# Patient Record
Sex: Female | Born: 1970 | Hispanic: Yes | Marital: Single
Health system: Southern US, Community
[De-identification: ages and names within clinical notes are randomized; demographics above are authoritative.]

## PROBLEM LIST (undated history)

## (undated) DIAGNOSIS — Z1371 Encounter for nonprocreative screening for genetic disease carrier status: Secondary | ICD-10-CM

---

## 2007-07-10 ENCOUNTER — Ambulatory Visit: Payer: Self-pay

## 2007-07-15 ENCOUNTER — Ambulatory Visit: Payer: Self-pay

## 2008-07-16 ENCOUNTER — Encounter: Payer: Self-pay | Admitting: Maternal & Fetal Medicine

## 2008-12-20 ENCOUNTER — Observation Stay: Payer: Self-pay | Admitting: Obstetrics and Gynecology

## 2008-12-23 ENCOUNTER — Inpatient Hospital Stay: Payer: Self-pay | Admitting: Obstetrics and Gynecology

## 2010-07-06 ENCOUNTER — Ambulatory Visit: Payer: Self-pay

## 2010-08-05 ENCOUNTER — Ambulatory Visit: Payer: Self-pay

## 2011-09-19 ENCOUNTER — Ambulatory Visit: Payer: Self-pay

## 2012-11-27 ENCOUNTER — Ambulatory Visit: Payer: Self-pay

## 2014-05-06 ENCOUNTER — Ambulatory Visit: Payer: Self-pay

## 2014-11-18 ENCOUNTER — Ambulatory Visit: Payer: Self-pay | Attending: Oncology

## 2014-11-18 VITALS — BP 131/86 | HR 72 | Temp 97.9°F | Ht <= 58 in | Wt 145.5 lb

## 2014-11-18 DIAGNOSIS — IMO0002 Reserved for concepts with insufficient information to code with codable children: Secondary | ICD-10-CM

## 2014-11-18 NOTE — Progress Notes (Signed)
Subjective:     Patient ID: Eyvette Cordon, female   DOB: 10/08/1970, 44 y.o.   MRN: [id number]  HPI   Review of Systems     Objective:   Physical Exam  Genitourinary: No labial fusion. There is no rash, tenderness, lesion or injury on the right labia. There is no rash, tenderness, lesion or injury on the left labia. Uterus is not deviated, not enlarged, not fixed and not tender. Cervix exhibits no motion tenderness, no discharge and no friability. Right adnexum displays no mass, no tenderness and no fullness. Left adnexum displays no mass, no tenderness and no fullness. No erythema, tenderness or bleeding in the vagina. No foreign body around the vagina. No signs of injury around the vagina. No vaginal discharge found.       Assessment:  44 year old patient returns for 6 month follow-up pap.  She had a benign cervical biopsy in April 2016 with Dr. Jean Rosenthal at [place].  Normal pelvic exam.     Plan:     Specimen collected for pap.  Claretha Cooper Murr interpreted exam.

## 2014-11-26 LAB — PAP LB AND HPV HIGH-RISK
HPV, high-risk: POSITIVE — AB
PAP Smear Comment: 0

## 2014-11-30 ENCOUNTER — Telehealth: Payer: Self-pay | Admitting: *Deleted

## 2014-11-30 ENCOUNTER — Encounter: Payer: Self-pay | Admitting: *Deleted

## 2014-11-30 NOTE — Telephone Encounter (Signed)
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  Called patient via the language line.  Left message for her to return my call.

## 2014-12-02 ENCOUNTER — Telehealth: Payer: Self-pay | Admitting: *Deleted

## 2014-12-02 NOTE — Progress Notes (Signed)
Kimberly Gordon to schedule appointment with Dr. Jean Rosenthal at [facility] for consult/colposcopy.  If unable to reach patient by phone , will mail request for her to call for results, and appointment date, and time.

## 2014-12-08 ENCOUNTER — Telehealth: Payer: Self-pay | Admitting: *Deleted

## 2014-12-08 NOTE — Telephone Encounter (Signed)
Dr. Jean Rosenthal from [facility] called today.  Patient was in his office, and he was not sure why she needed to be seen, neither did the patient.  Explained that last pap on 9/22 was HPV + LGSIL.  He stated he should have recommended to [place] to repeat the pap in 1 year, instead of 6 months from her benign biopsy in March 2016.  He will see her today.  We are to schedule patient to return in March for her annual screening and per Dr. Jean Rosenthal another pap smear.  Hazle Quant will call patient tomorrow and get her scheduled.

## 2014-12-09 ENCOUNTER — Telehealth: Payer: Self-pay | Admitting: *Deleted

## 2014-12-10 NOTE — Telephone Encounter (Signed)
See note from 12/08/14

## 2014-12-10 NOTE — Progress Notes (Signed)
Letter received from Dr. Jean Rosenthal stating patient does not need colposcopy, but will need annual pap in March 2017.  Kimberly Gordon contacting patient to schedule annual [facility] screening.

## 2014-12-11 ENCOUNTER — Telehealth: Payer: Self-pay | Admitting: *Deleted

## 2015-05-10 ENCOUNTER — Ambulatory Visit: Payer: Self-pay | Attending: Oncology

## 2015-05-12 ENCOUNTER — Ambulatory Visit: Payer: Self-pay

## 2015-09-22 ENCOUNTER — Ambulatory Visit
Admission: RE | Admit: 2015-09-22 | Discharge: 2015-09-22 | Disposition: A | Discharge status: Home / Self Care | Payer: Self-pay | Source: Ambulatory Visit | Attending: Internal Medicine | Admitting: Internal Medicine

## 2015-09-22 ENCOUNTER — Ambulatory Visit: Payer: Self-pay | Attending: Internal Medicine | Admitting: *Deleted

## 2015-09-22 ENCOUNTER — Encounter: Payer: Self-pay | Admitting: *Deleted

## 2015-09-22 VITALS — BP 125/81 | HR 71 | Temp 98.1°F | Ht 59.06 in | Wt 154.3 lb

## 2015-09-22 DIAGNOSIS — Z0000 Encounter for general adult medical examination without abnormal findings: Secondary | ICD-10-CM

## 2015-09-22 HISTORY — DX: Encounter for nonprocreative screening for genetic disease carrier status: Z13.71

## 2015-09-22 NOTE — Patient Instructions (Signed)
Gave patient hand-out, Women Staying Healthy, Active and Well from [facility], with education on breast health, pap smears, heart and colon health. 

## 2015-09-22 NOTE — Progress Notes (Signed)
Subjective:     Patient ID: Kimberly Gordon. Kimberly Gordon, female   DOB: 11-18-1970, 45 y.o.   MRN: [id number]  HPI   Review of Systems     Objective:   Physical Exam  Pulmonary/Chest: Right breast exhibits no inverted nipple, no mass, no nipple discharge, no skin change and no tenderness. Left breast exhibits no inverted nipple, no mass, no nipple discharge, no skin change and no tenderness. Breasts are symmetrical.  Abdominal: There is no splenomegaly or hepatomegaly.  Genitourinary: Rectal exam shows no external hemorrhoid and no internal hemorrhoid. No labial fusion. There is no rash, tenderness, lesion or injury on the right labia. There is no rash, tenderness, lesion or injury on the left labia. Cervix exhibits no motion tenderness, no discharge and no friability. Right adnexum displays no mass, no tenderness and no fullness. Left adnexum displays no mass, no tenderness and no fullness. No erythema, tenderness or bleeding in the vagina. No foreign body around the vagina. No signs of injury around the vagina. Vaginal discharge found.  White non odorous discharge       Assessment:     45 year old Hispanic [facility]  Kimberly Gordon, the interpreter present during the interview and exam.  Clinical breast exam unremarkable.  Taught self breast awareness.  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  Per Dr. Jean Gordon, patient would need a pap in one year.  Specimen collected for pap.  Patient has been screened for eligibility.  She does not have any insurance, Medicare or Medicaid.  She also meets financial eligibility.  Hand-out given on the Affordable Care Act.      Plan:     Screening mammogram ordered.  Specimen sent to the lab.  Will follow up per [facility] protocol.

## 2015-09-25 LAB — PAP LB AND HPV HIGH-RISK
HPV, high-risk: POSITIVE — AB
PAP Smear Comment: 0

## 2015-10-07 ENCOUNTER — Encounter: Payer: Self-pay | Admitting: *Deleted

## 2015-10-07 NOTE — Progress Notes (Signed)
Patient called yesterday to get the results of her pap smear.  Reviewed results via the interpreter Annice Pih.  Informed of normal mammo and pap, but HPV positive.  Scheduled to return to [facility] in one year with annual exam and pap.

## 2016-09-27 ENCOUNTER — Ambulatory Visit: Payer: Self-pay | Attending: Oncology

## 2016-11-29 ENCOUNTER — Encounter: Payer: Self-pay | Admitting: *Deleted

## 2016-11-29 ENCOUNTER — Ambulatory Visit
Admission: RE | Admit: 2016-11-29 | Discharge: 2016-11-29 | Disposition: A | Discharge status: Home / Self Care | Payer: Self-pay | Source: Ambulatory Visit | Attending: Oncology | Admitting: Oncology

## 2016-11-29 ENCOUNTER — Ambulatory Visit: Payer: Self-pay | Attending: Oncology | Admitting: *Deleted

## 2016-11-29 VITALS — BP 118/62 | HR 54 | Temp 98.7°F | Resp 18 | Ht <= 58 in | Wt 137.7 lb

## 2016-11-29 DIAGNOSIS — Z0000 Encounter for general adult medical examination without abnormal findings: Secondary | ICD-10-CM

## 2016-11-29 NOTE — Patient Instructions (Signed)
Prueba del VPH (HPV Test) La prueba del virus del papiloma humano (VPH) se usa para detectar los tipos de infeccin por el VPH de alto riesgo. El VPH es un grupo de alrededor de 100 virus. Muchos de estos virus causan tumores dentro de los genitales, sobre ellos o a su alrededor. La mayora de los VPH provocan infecciones que suelen desaparecen sin tratamiento. Sin embargo, los tipos 6, 11, 16 y 18 del VPH se consideran de alto riesgo y pueden aumentar el riesgo de padecer cncer de cuello del tero o de ano si la infeccin no se trata. La prueba del VPH identifica las cadenas de ADN (genticas) de la infeccin por el VPH, por lo que tambin se denomina prueba de ADN para el VPH. Aunque el VPH se encuentra tanto en los hombres como en las mujeres, la prueba del VPH se usa solo para detectar un mayor riesgo de cncer en las mujeres:  Con una prueba de Papanicolaou anormal.  Despus del tratamiento de una prueba de Papanicolaou anormal.  Entre 30y 65aos.  Despus del tratamiento de una infeccin por el VPH de alto riesgo. La prueba del VPH se puede hacer al mismo tiempo que un examen plvico y una prueba de Papanicolaou en mujeres de ms de 30 aos. Tanto la prueba del VPH como la prueba de Papanicolaou requieren una muestra de clulas del cuello del tero. PREPARACIN PARA EL ESTUDIO  No se haga lavados vaginales ni se d un bao durante 24 a 48 horas antes de la prueba o como se lo haya indicado el mdico.  No tenga sexo durante 24 a 48 horas antes de la prueba o como se lo haya indicado el mdico.  Es posible que se le pida que reprograme la prueba si est menstruando.  Se le pedir que orine antes de la prueba. RESULTADOS Es su responsabilidad retirar el resultado del estudio. Consulte en el laboratorio o en el departamento en el que fue realizado el estudio cundo y cmo podr obtener los resultados. Hable con el mdico si tiene alguna pregunta sobre los resultados. El resultado ser  negativo o positivo. Significado de los resultados negativos de la prueba Un resultado negativo de la prueba del VPH significa que no se detect el VPH, y es muy probable que no tenga el virus. Significado de los resultados positivos de la prueba Un resultado positivo de la prueba del VPH indica que tiene el virus.  Si el resultado de la prueba muestra la presencia de alguna cadena del VPH de alto riesgo, puede tener mayor riesgo de padecer cncer de cuello del tero o de ano si la infeccin no se trata.  Si se encuentran cadenas del VPH de bajo riesgo, es poco probable que tenga un alto riesgo de padecer cncer. Hable con el mdico sobre los resultados. El mdico utilizar los resultados para realizar un diagnstico y determinar un plan de tratamiento adecuado para usted. Esta informacin no tiene como fin reemplazar el consejo del mdico. Asegrese de hacerle al mdico cualquier pregunta que tenga. Document Released: 06/08/2008 Document Revised: 03/13/2014 Document Reviewed: 07/08/2013 Elsevier Interactive Patient Education  2017 Elsevier Inc.  Gave patient hand-out, Women Staying Healthy, Active and Well from [facility], with education on breast health, pap smears, heart and colon health.  

## 2016-11-29 NOTE — Progress Notes (Signed)
Subjective:     Patient ID: Kimberly Gordon, female   DOB: 1970/12/14, 46 y.o.   MRN: [id number]  HPI   Review of Systems     Objective:   Physical Exam  Pulmonary/Chest: Right breast exhibits no inverted nipple, no mass, no nipple discharge, no skin change and no tenderness. Left breast exhibits no inverted nipple, no mass, no nipple discharge, no skin change and no tenderness. Breasts are symmetrical.  Abdominal: There is no splenomegaly or hepatomegaly.  Genitourinary: No labial fusion. There is no rash, tenderness, lesion or injury on the right labia. There is no rash, tenderness, lesion or injury on the left labia. Cervix exhibits no motion tenderness, no discharge and no friability. Right adnexum displays no mass, no tenderness and no fullness. Left adnexum displays no mass, no tenderness and no fullness. No erythema, tenderness or bleeding in the vagina. No foreign body in the vagina. No signs of injury around the vagina. Vaginal discharge found.  Genitourinary Comments: Bloody vaginal discharge noted.  Patient states she just finished her menstrual cycle on  year old Hispanic female returns to [facility] for [facility] screening.  Lloyda, the interpreter present during the interview and exam.  Clinical breast exam unremarkable.  Taught self breast awareness.  Last pap on 09/22/15 was negative / HPV positive.  Specimen collected for pap smear.  Patient has been screened for eligibility.  She does not have any insurance, Medicare or Medicaid.  She also meets financial eligibility.  Hand-out given on the Affordable Care Act.    Plan:     Screening mammogram ordered.  Specimen for pap sent to the lab.  Explained to patient if the pap results show HPV positive or other abnormal findings will refer her back to Dr. Jean Rosenthal.  Will follow-up per [facility] protocol.

## 2016-11-30 LAB — PAP LB AND HPV HIGH-RISK
HPV, high-risk: NEGATIVE
PAP Smear Comment: 0

## 2016-12-04 ENCOUNTER — Telehealth: Payer: Self-pay | Admitting: *Deleted

## 2016-12-04 NOTE — Telephone Encounter (Addendum)
Patient has a negative / HPV positive pap smear for the second year in a row.  She will need referral to gyn for colposcopy.  Left message via Kandis Cocking, the interpreter for patient to return my call.   Patient returned my call via Kandis Cocking, the interpreter.  Informed her of her HPV positive normal pap and need for possible colposcopy.  She had seen Dr. Jean Rosenthal at [facility] in the past.  I will schedule her to see him, but patient could not stay on the phone.  Left message per patient's request via Lloyda the interpreter, with the date of her appointment with Dr. Jean Rosenthal on Wednesday, 12/27/16 @ 2:00.

## 2016-12-27 ENCOUNTER — Ambulatory Visit: Payer: Self-pay | Admitting: Obstetrics and Gynecology

## 2017-01-16 ENCOUNTER — Telehealth: Payer: Self-pay | Admitting: *Deleted

## 2017-01-16 NOTE — Telephone Encounter (Signed)
Called patient via Kimberly Gordon, the interpreter.  Patient did not show up for her appointment with Dr. Jean RosenthalJackson.  Left her message to return my call.  I would like to reschedule her appointment.

## 2017-02-06 ENCOUNTER — Encounter: Payer: Self-pay | Admitting: *Deleted

## 2017-02-06 NOTE — Progress Notes (Signed)
Called patient via Kandis CockingMaritza, the interpreter.  I wanted to reschedule the patient to see Dr. Jean RosenthalJackson for further evaluation of her second HPV positive / negative pap smear over the past 2 years.  Patient states she did not reschedule the appointment she missed, because [place] is in [place] and will not be back to [place] until February.  Offered to set up an appointment for March, but patient states she will just call me when she is back.  Will close current [facility] case as refusal to follow up and will re-open when she returns.  [facility] to [place].

## 2019-09-27 ENCOUNTER — Other Ambulatory Visit: Payer: Self-pay

## 2019-09-27 ENCOUNTER — Ambulatory Visit: Payer: Self-pay | Attending: Internal Medicine

## 2019-09-27 DIAGNOSIS — Z23 Encounter for immunization: Secondary | ICD-10-CM

## 2019-09-27 NOTE — Progress Notes (Signed)
   Covid-19 Vaccination Clinic  Name:  Kimberly Gordon    MRN: [id number] DOB: August 17, 1970  09/27/2019  Ms. Kimberly Gordon was observed post Covid-19 immunization for 15 minutes without incident. She was provided with Vaccine Information Sheet and instruction to access the V-Safe system.   Ms. Kimberly Gordon was instructed to call 911 with any severe reactions post vaccine: Marland Kitchen Difficulty breathing  . Swelling of face and throat  . A fast heartbeat  . A bad rash all over body  . Dizziness and weakness   Immunizations Administered    Name Date Dose VIS Date Route   Pfizer COVID-19 Vaccine 09/27/2019  8:21 AM 0.3 mL 04/30/2018 Intramuscular   Manufacturer: [employer], [employer]   Lot: [id number]   NDC: [address]-1

## 2019-10-06 ENCOUNTER — Ambulatory Visit: Payer: Self-pay

## 2019-10-20 ENCOUNTER — Ambulatory Visit: Payer: Self-pay | Attending: Internal Medicine

## 2019-10-20 ENCOUNTER — Ambulatory Visit: Payer: Self-pay

## 2019-10-20 DIAGNOSIS — Z23 Encounter for immunization: Secondary | ICD-10-CM

## 2019-10-20 NOTE — Progress Notes (Signed)
   Covid-19 Vaccination Clinic  Name:  Kimberly Gordon    MRN: [id number] DOB: 09-Dec-1970  10/20/2019  Ms. Kimberly Gordon was observed post Covid-19 immunization for 15 minutes without incident. She was provided with Vaccine Information Sheet and instruction to access the V-Safe system.   Ms. Kimberly Gordon was instructed to call 911 with any severe reactions post vaccine: Marland Kitchen Difficulty breathing  . Swelling of face and throat  . A fast heartbeat  . A bad rash all over body  . Dizziness and weakness   Immunizations Administered    Name Date Dose VIS Date Route   Pfizer COVID-19 Vaccine 10/20/2019  4:25 PM 0.3 mL 04/30/2018 Intramuscular   Manufacturer: [employer], [employer]   Lot: [id number]   NDC: [address]-4

## 2019-12-17 ENCOUNTER — Encounter (INDEPENDENT_AMBULATORY_CARE_PROVIDER_SITE_OTHER): Payer: Self-pay

## 2019-12-17 ENCOUNTER — Ambulatory Visit
Admission: RE | Admit: 2019-12-17 | Discharge: 2019-12-17 | Disposition: A | Discharge status: Home / Self Care | Payer: Self-pay | Source: Ambulatory Visit | Attending: Oncology | Admitting: Oncology

## 2019-12-17 ENCOUNTER — Ambulatory Visit: Payer: Self-pay | Attending: Oncology

## 2019-12-17 ENCOUNTER — Other Ambulatory Visit: Payer: Self-pay

## 2019-12-17 VITALS — BP 127/65 | HR 63 | Temp 98.3°F | Ht 59.0 in | Wt 141.0 lb

## 2019-12-17 DIAGNOSIS — Z0000 Encounter for general adult medical examination without abnormal findings: Secondary | ICD-10-CM

## 2019-12-17 NOTE — Progress Notes (Signed)
  Subjective:     Patient ID: Kimberly Gordon, female   DOB: Sep 07, 1970, 49 y.o.   MRN: [id number]  HPI   Review of Systems     Objective:   Physical Exam     Assessment:       Plan:

## 2019-12-17 NOTE — Progress Notes (Signed)
  Subjective:     Patient ID: Sharlyne Cai, female   DOB: [birth year], 49 y.o.   MRN: [id number]  HPI   Review of Systems     Objective:   Physical Exam Chest:     Breasts:        Right: No swelling, bleeding, inverted nipple, mass, nipple discharge, skin change or tenderness.        Left: No swelling, bleeding, inverted nipple, mass, nipple discharge, skin change or tenderness.  Genitourinary:    Labia:        Right: No rash, tenderness, lesion or injury.        Left: No rash, tenderness, lesion or injury.      Vagina: No signs of injury and foreign body. No vaginal discharge, erythema, tenderness, bleeding, lesions or prolapsed vaginal walls.     Cervix: No cervical motion tenderness, discharge, friability, lesion, erythema, cervical bleeding or eversion.     Uterus: Not deviated, not enlarged, not fixed, not tender and no uterine prolapse.      Adnexa:        Right: No mass, tenderness or fullness.         Left: No mass, tenderness or fullness.          Assessment:     49 year old Hispanic patient returns for [facility] screening.  Will repeat pap for 2 negative annual results.  Patient screened, and meets [facility] eligibility.  Patient does not have insurance, Medicare or Medicaid. Instructed patient on breast self awareness using teach back method.  Clinical breast exam unremarkable.  Pelvic exam normal.  Delos Haring interpreted exam.    Plan:     Sent for bilateral screening mammogram.  Specimen collected for pap.

## 2019-12-19 ENCOUNTER — Other Ambulatory Visit: Payer: Self-pay | Admitting: *Deleted

## 2019-12-19 DIAGNOSIS — N6489 Other specified disorders of breast: Secondary | ICD-10-CM

## 2019-12-22 LAB — IGP, APTIMA HPV: HPV Aptima: NEGATIVE

## 2020-01-02 ENCOUNTER — Ambulatory Visit: Payer: Self-pay

## 2020-01-19 ENCOUNTER — Other Ambulatory Visit: Payer: Self-pay

## 2020-01-23 ENCOUNTER — Telehealth: Payer: Self-pay | Admitting: *Deleted

## 2020-01-23 NOTE — Telephone Encounter (Signed)
PT NOS AV / MAMMO APPT 

## 2020-02-09 NOTE — Progress Notes (Signed)
Patient ID: Kimberly Gordon, female   DOB: 1970-03-28, 49 y.o.   MRN: [id number] Reached patient to rescheduled additional views.  She is scheduled for 02/20/20 at 1:00p.m.  Verbalizes understanding via interpreter.

## 2020-02-20 ENCOUNTER — Ambulatory Visit
Admission: RE | Admit: 2020-02-20 | Discharge: 2020-02-20 | Disposition: A | Discharge status: Home / Self Care | Payer: Self-pay | Source: Ambulatory Visit | Attending: Oncology | Admitting: Oncology

## 2020-02-20 ENCOUNTER — Other Ambulatory Visit: Payer: Self-pay

## 2020-02-20 DIAGNOSIS — N6489 Other specified disorders of breast: Secondary | ICD-10-CM

## 2020-04-25 NOTE — Progress Notes (Signed)
Letter mailed to patient to notify of normal mammogram, and pap smear results. Repeat pap in one year.  Copy to [facility].

## 2021-08-15 IMAGING — MG MM DIGITAL DIAGNOSTIC UNILAT*L* W/ TOMO W/ CAD
4 series · 4 of 12 positions shown · non-contrast
Comparison: Previous exam(s).

CLINICAL DATA: SCREENING RECALL FOR A LEFT BREAST ASYMMETRY.

EXAM:
DIGITAL DIAGNOSTIC UNILATERAL LEFT MAMMOGRAM WITH TOMO AND CAD;
ULTRASOUND LEFT BREAST LIMITED

[L ML synth-2D]
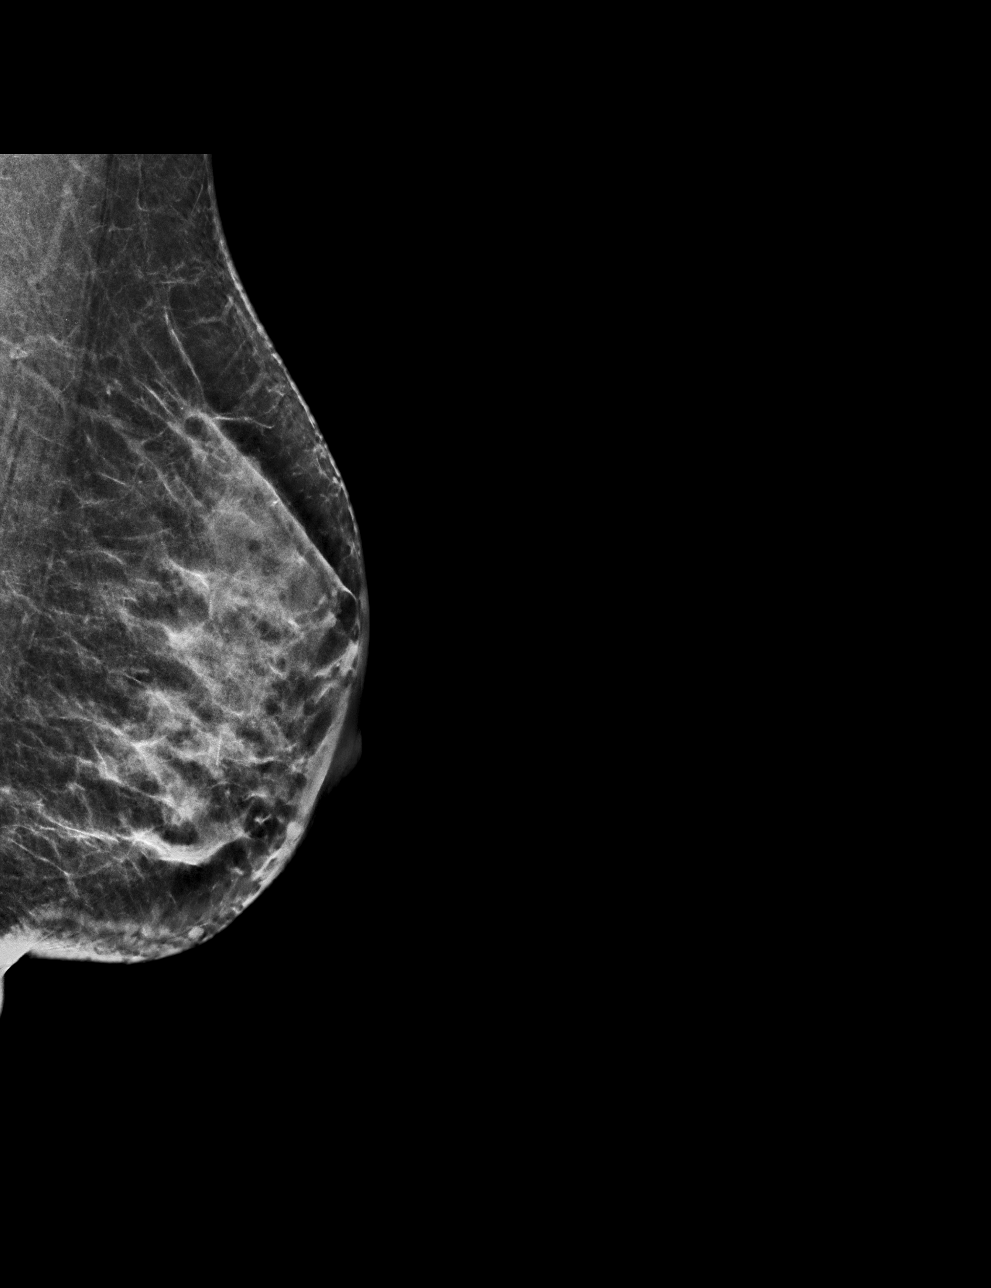

[L CC synth-2D]
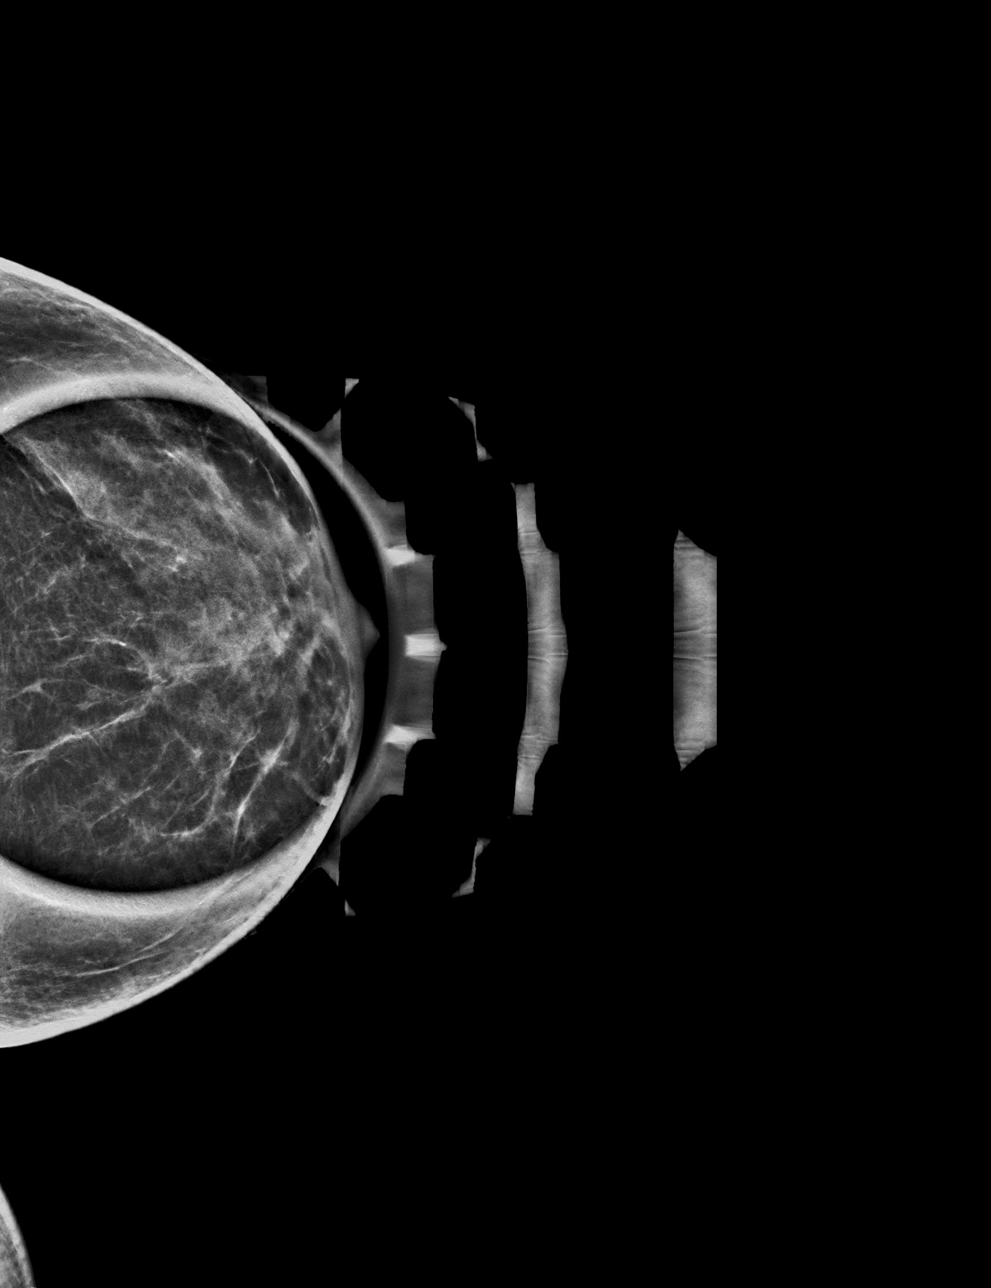

[L ML tomo · tomo slice 27/54.0]
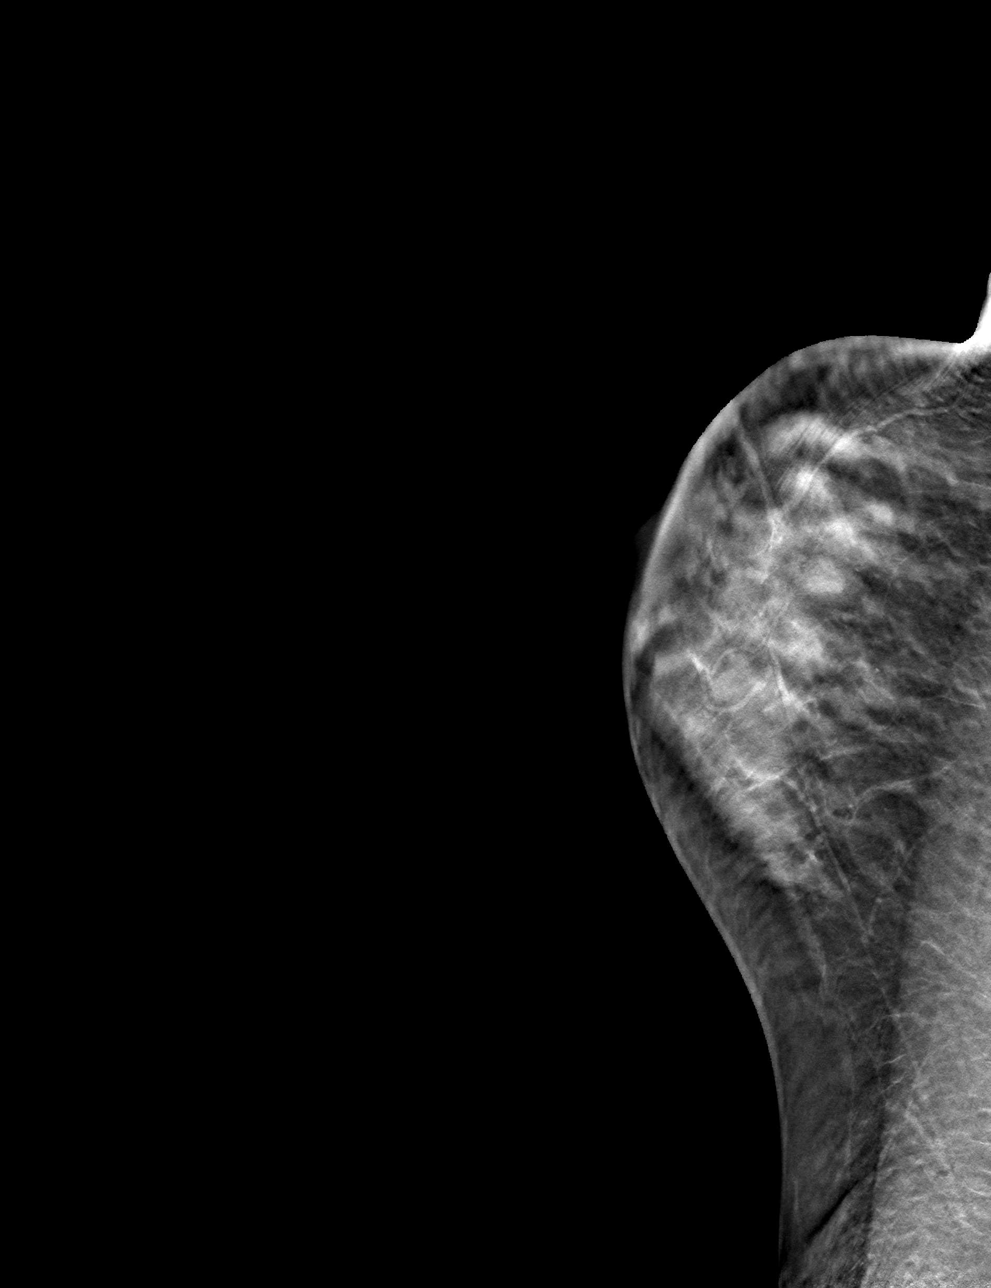

[L CC tomo · tomo slice 23/46.0]
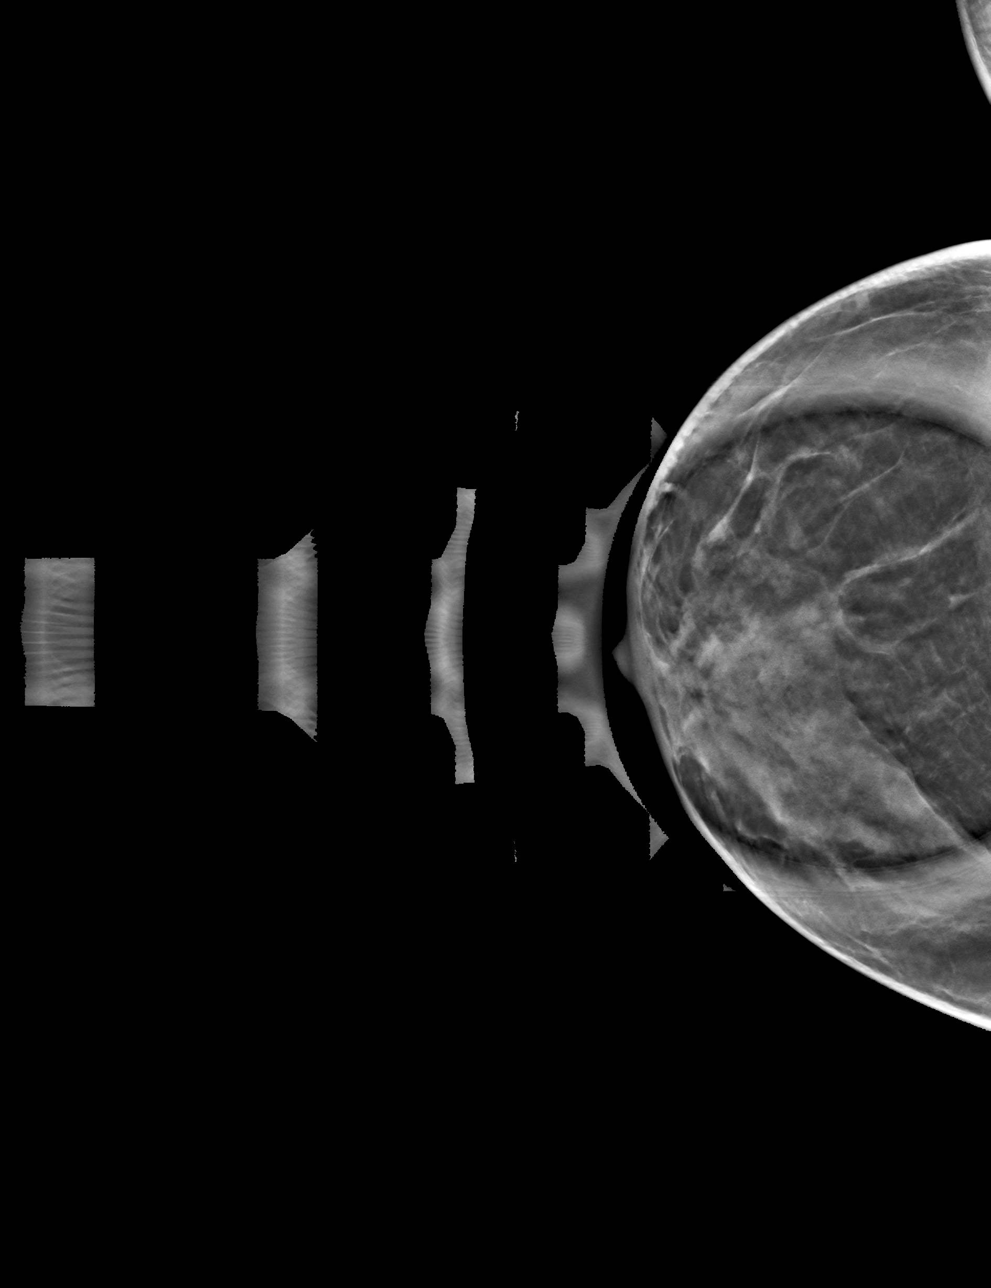

[4 of 12 positions shown; findings below may reference images not displayed]

ACR Breast Density Category c: The breast tissue is heterogeneously
dense, which may obscure small masses.
FINDINGS: The asymmetry of concern in the central left breast appears to
resolve on spot compression tomosynthesis imaging. No suspicious
findings are seen on the lateral view of the left breast.

Mammographic images were processed with CAD.

Ultrasound of the central left breast scanned from the 12 o'clock
axis and the 6 o'clock axis demonstrates normal fibroglandular
tissue with scattered fibrocystic changes. No suspicious masses or
areas of shadowing are identified.
IMPRESSION: 1.  No persistent suspicious findings are seen in the left breast.

RECOMMENDATION:
Screening mammogram in one year.(Code:[id number])

I have discussed the findings and recommendations with the patient.
If applicable, a reminder letter will be sent to the patient
regarding the next appointment.

BI-RADS CATEGORY  2: Benign.
